# Patient Record
Sex: Male | Born: 1955 | Race: White | Hispanic: No | Marital: Married | Smoking: Current some day smoker
Health system: Southern US, Community
[De-identification: ages and names within clinical notes are randomized; demographics above are authoritative.]

## PROBLEM LIST (undated history)

## (undated) ENCOUNTER — Emergency Department (HOSPITAL_BASED_OUTPATIENT_CLINIC_OR_DEPARTMENT_OTHER): Admission: EM | Payer: Medicare (Managed Care) | Source: Home / Self Care

## (undated) DIAGNOSIS — F329 Major depressive disorder, single episode, unspecified: Secondary | ICD-10-CM

## (undated) DIAGNOSIS — M1990 Unspecified osteoarthritis, unspecified site: Secondary | ICD-10-CM

## (undated) DIAGNOSIS — M109 Gout, unspecified: Secondary | ICD-10-CM

## (undated) DIAGNOSIS — F32A Depression, unspecified: Secondary | ICD-10-CM

---

## 1999-04-30 ENCOUNTER — Ambulatory Visit (HOSPITAL_COMMUNITY): Admission: RE | Admit: 1999-04-30 | Discharge: 1999-04-30 | Payer: Self-pay | Admitting: Gastroenterology

## 2003-06-12 ENCOUNTER — Emergency Department (HOSPITAL_COMMUNITY): Admission: EM | Admit: 2003-06-12 | Discharge: 2003-06-12 | Payer: Self-pay | Admitting: Emergency Medicine

## 2003-06-12 ENCOUNTER — Encounter: Payer: Self-pay | Admitting: Emergency Medicine

## 2003-11-14 ENCOUNTER — Encounter: Admission: RE | Admit: 2003-11-14 | Discharge: 2003-11-14 | Payer: Self-pay | Admitting: Family Medicine

## 2004-10-01 ENCOUNTER — Ambulatory Visit (HOSPITAL_COMMUNITY): Admission: RE | Admit: 2004-10-01 | Discharge: 2004-10-01 | Payer: Self-pay | Admitting: Gastroenterology

## 2006-04-20 ENCOUNTER — Encounter (INDEPENDENT_AMBULATORY_CARE_PROVIDER_SITE_OTHER): Payer: Self-pay | Admitting: *Deleted

## 2006-04-20 ENCOUNTER — Ambulatory Visit (HOSPITAL_COMMUNITY): Admission: RE | Admit: 2006-04-20 | Discharge: 2006-04-21 | Payer: Self-pay | Admitting: Otolaryngology

## 2007-07-12 ENCOUNTER — Ambulatory Visit (HOSPITAL_BASED_OUTPATIENT_CLINIC_OR_DEPARTMENT_OTHER): Admission: RE | Admit: 2007-07-12 | Discharge: 2007-07-12 | Payer: Self-pay | Admitting: Orthopedic Surgery

## 2007-08-16 ENCOUNTER — Ambulatory Visit (HOSPITAL_BASED_OUTPATIENT_CLINIC_OR_DEPARTMENT_OTHER): Admission: RE | Admit: 2007-08-16 | Discharge: 2007-08-16 | Payer: Self-pay | Admitting: Orthopedic Surgery

## 2008-07-24 ENCOUNTER — Ambulatory Visit (HOSPITAL_BASED_OUTPATIENT_CLINIC_OR_DEPARTMENT_OTHER): Admission: RE | Admit: 2008-07-24 | Discharge: 2008-07-24 | Payer: Self-pay | Admitting: Orthopedic Surgery

## 2011-05-03 NOTE — Op Note (Signed)
Ralph Gutierrez, Ralph Gutierrez               ACCOUNT NO.:  [id number]   MEDICAL RECORD NO.:  [id number]          PATIENT TYPE:  AMB   LOCATION:  DSC                          FACILITY:  [facility]   PHYSICIAN:  Rodney A. Mortenson, M.D.DATE OF BIRTH:  August 02, 1956   DATE OF PROCEDURE:  07/12/2007  DATE OF DISCHARGE:                               OPERATIVE REPORT   JUSTIFICATION:  55 year old male with a two month history of pain about  his right shoulder.  No injury, just gradually increasing pain about the  shoulder.  He does a lot of extensive heavy lifting, pushing, and  pulling.  He has tried all types of anti-inflammatory drugs which have  failed.  Examination reveals acute tenderness over the [facility] joint and  cross arm test is quite positive.  Impingement testing is slightly  positive.  An MRI was done that shows a partial tear of the  supraspinatus, no full thickness tear, there is bony edema of the distal  clavicle with significant degenerative changes and osteoarthritis of the  [facility] joint and inferior projection of osteophytes.  He is now admitted for  surgical decompression.  Complications were discussed preoperatively.  Questions were answered and encouraged.   JUSTIFICATION FOR OUTPATIENT SURGERY:  Minimal morbidity.   PREOPERATIVE DIAGNOSIS:  Impingement syndrome, right shoulder; bone  spurs with inferior osteophytes acromioclavicular joint; osteoarthritis  acromioclavicular joint, right shoulder.   POSTOPERATIVE DIAGNOSIS:  Impingement syndrome, right shoulder; bone  spurs with inferior osteophytes acromioclavicular joint; osteoarthritis  acromioclavicular joint, right shoulder.   OPERATION:  Diagnostic arthroscopy, right shoulder; arthroscopic  acromioplasty with removal inferior bone spur; resection of right distal  clavicle arthroscopically.   SURGEON:  Lenard Galloway. Chaney Malling, M.D.   ASSISTANT:  Legrand Pitts. Duffy, P.A.-C.   ANESTHESIA:  General.   PROCEDURE:  The patient was placed on  the operating table in a supine  position.  After satisfactory general anesthesia, the patient was placed  in a semi-sitting position.  The right shoulder and upper extremity was  prepped with DuraPrep and draped out in the usual manner.  Standard  posterior portal was made and the arthroscope was introduced.  The  glenohumeral joint was visualized first.  The articular cartilage over  the humeral head and glenoid was actually normal as was the entire  circumference of the labrum.  The biceps was visualized all the way out  to its exit and this appeared normal.  There was some slight fraying of  the articular surface of the supraspinatus but no full thickness tear  was seen.  The arthroscope was then removed.   The arthroscope was placed in the subacromial space.  An anterolateral  portal was made.  The ArthroCare wand was inserted.  Soft tissues were  ablated.  The inferior surface of the acromion was seen.  Soft tissue  was stripped off.  The [facility] joint was identified and under surface of  distal clavicle had the soft tissue removed.  There were large bony  spurs on the anterior inferior aspect acromion as it abutted up against  the [facility] joint.  This clearly caused impingement  problems.  An anterior  portal was made at the level of the [facility] joint and an operating cannula  was placed in this position.  Through the anterolateral portal, the 6 mm  bur was inserted and a generous acromioplasty was done.  Inferior  osteophytes were then totally removed.  This exposed the [facility] joint very  nicely.  The blade was then used to remove all the soft tissue of the  inferior capsule of the [facility] joint and distal clavicle could clearly be  seen.  Through the anterior portal, the 6 mm bur was inserted.  The  under surface of the clavicle was debrided with bone spurs and then the  [facility] joint itself was entered with the bur.  About 6 mm resection distal  clavicle was achieved working from anterior to posterior.  Once  this was  accomplished to my satisfaction, the arthroscope was then placed in the  anterior portal and the arthroscope was passed down the trough was made  with a bur.  This completed decompression of distal clavicle from  posterior to anterior.  The clavicle was still quite stable.  I was  quite pleased with the decompression that was achieved.  Throughout the  procedure, bleeders were coagulated, there was good visualization  throughout.  The shoulder was then filled with Marcaine and sutures used  to close the wounds.  A large bulky dressing was applied and the patient  returned to the recovery room in excellent condition.  Technically, I  was extremely pleased with the surgical procedure.   DISPOSITION:  1. Percocet for pain.  2. To my office next week.  3. Usual postop instructions given.           ______________________________  Lenard Galloway. Chaney Malling, M.D.     RAM/MEDQ  D:  07/12/2007  T:  07/12/2007  Job:  [id number]

## 2011-05-03 NOTE — Op Note (Signed)
Ralph Gutierrez, Ralph Gutierrez               ACCOUNT NO.:  [id number]   MEDICAL RECORD NO.:  [id number]          PATIENT TYPE:  AMB   LOCATION:  DSC                          FACILITY:  [facility]   PHYSICIAN:  Rodney A. Mortenson, M.D.DATE OF BIRTH:  1956/11/18   DATE OF PROCEDURE:  08/16/2007  DATE OF DISCHARGE:                               OPERATIVE REPORT   JUSTIFICATION:  A 55 year old male who underwent arthroscopy of the  right shoulder in July 12, 2007 for osteoarthritis of right AC joint and  impingement syndrome.  He had acromioplasty and resection distal  clavicle through the arthroscope.  Postoperatively, he had a persistent  draining sinus through the anterior lateral portal.  Cultures taken  throughout.  He was never sick, never febrile.  Cultures were all  negative but the drainage has persisted and was not diminished.  He is  now admitted for evaluation of his shoulder to repair the draining sinus  or defect in the deltoid and see if there was a tear in the rotator  cuff.  At the time of initial surgery when the arthroscope was placed in  the shoulder itself, no operative portal was placed into the  glenohumeral joint.  All surgery was essentially done in the subacromial  space.  Questions answered, encouraged, complications discussed  extensively.   JUSTIFICATION FOR OUTPATIENT SETTING:  Minimal morbidity.   PREOPERATIVE DIAGNOSIS:  Draining sinus anterior lateral portal, right  shoulder.   POSTOPERATIVE DIAGNOSIS:  Draining sinus anterior lateral portal, right  shoulder defect in deltoid; subacromial bursitis.   OPERATION:  Diagnostic arthroscopy, glenohumeral joint; bursectomy  subacromial space; repair defect in the deltoid.   SURGEON:  Lenard Galloway. Chaney Malling, M.D.   Threasa HeadsChestine Spore.   ANESTHESIA:  General.   PROCEDURE:  The patient placed on the operating table in supine  position.  After satisfactory general anesthesia, the patient placed in  semi-sitting position,  right shoulder and upper extremity was prepped  with DuraPrep and draped out in the usual manner.  The scope placed in  the posterior portal in glenohumeral joint.  Very careful examination of  the joint was undertaken.  There was normal articular cartilage of the  humeral head in the glenoid.  The anterior labrum was normal as was the  subscapularis.  The biceps was visualized and was normal.  Very careful  exam of the anterior capsule above the subscapularis and above the  biceps was done.  No rents or tears were seen.  It should be noted at  the initial arthroscopic procedure there was no entrance into the  glenohumeral joint anteriorly.  No obvious defects could be seen.  The  arthroscope was removed from the glenohumeral humeral joint.   Arthroscope was then inserted into the subacromial space.  There is fair  amount of inflammation in the bursa.  Through the anterolateral portal  as a pressure was turned up on the arthroscope some drainage was seen.  An incision over lateral side through the anterolateral portal was  opened up somewhat and an ArthroCare wand was inserted.  A fair amount  of time  was spent ablating the remaining bursal tissue which was  inflamed.  No obvious infections were seen.  Once the ablation was  completed to my satisfaction, very careful exam of the subacromial  portion of the rotator cuff was done and no obvious tears were seen.  The large cannula was placed the anterior lateral portal and the  arthroscope removed from the subacromial space and placed in the  shoulder.  The pressure was turned up to 100 as this was injected into  the glenohumeral joint and no fluid came out of the large portal placed  in subacromial space.  There was absolutely no drainage through the cuff  that could be determined even at these very high pressures.  I think  this proved conclusively there were no tears in the cuff that were  causing the initial drainage and the arthroscope  was removed from  glenohumeral joint.  The cannula in the anterolateral portal was then  removed.  The anterolateral portal incision was extended proximal  distally.  The small pinhole could clearly be seen with draining sinus  extended into the subacromial space.  Deltoid fibers were teased from  the underlying skin.  The large 0 PDS sutures were used in mattress  fashion to close this very small sinus tract and repair the defect in  the deltoid.  Pressure was then turned back into the subacromial space  and there was absolutely no leakage through this site.  Skin was then  closed with stainless steel staples.  Sterile dressing applied and the  patient returned to recovery room in excellent condition.  Technically  this went extremely well.  I was very pleased with surgical outcome.   DISPOSITION:  1. Percocet, Keflex.  2. Sling right arm, do not use the arm.  3. To my office on Wednesday.           ______________________________  Lenard Galloway. Chaney Malling, M.D.     RAM/MEDQ  D:  08/16/2007  T:  08/17/2007  Job:  [id number]

## 2011-05-03 NOTE — Op Note (Signed)
NAMECHASETON, YEPIZ               ACCOUNT NO.:  [id number]   MEDICAL RECORD NO.:  [id number]          PATIENT TYPE:  AMB   LOCATION:  DSC                          FACILITY:  [facility]   PHYSICIAN:  Rodney A. Mortenson, M.D.DATE OF BIRTH:  Jan 25, 1956   DATE OF PROCEDURE:  07/24/2008  DATE OF DISCHARGE:                               OPERATIVE REPORT   JUSTIFICATION:  A 56 year old male with bilateral carpal tunnel, worse  on the left than on the right.  He has had significant symptoms and  bilateral positive Phalen test.  He is now admitted for decompression of  carpal tunnel on the left.  Complication were discussed preoperatively.  Questions answered and encouraged.   JUSTIFICATION FOR OUTPATIENT SURGERY:  Minimal morbidity.   PREOPERATIVE DIAGNOSIS:  Left carpal tunnel.   POSTOPERATIVE DIAGNOSIS:  Left carpal tunnel.   OPERATION:  Release of transverse carpal ligament, left wrist.   SURGEON:  Rodney A. Chaney Malling, MD   ANESTHESIA:  General.   PROCEDURE:  The patient was placed on the operating table in the supine  position with the pneumatic tourniquet above the left upper arm.  The  entire left upper extremity was prepped with DuraPrep and draped out in  the usual manner.  The left upper extremity was wrapped out with an  Esmarch and tourniquet was elevated.  Loupe magnification was used  throughout.  A lazy-S incision was made on the volar surface of the left  wrist starting proximal palmar crease and carried into the mid palmar  space.  Skin edges were retracted.  Fascia __________ was identified,  isolated, and opened and a curved Mayo scissors placed underneath the  transverse ligament and above the median nerve.  Under direct vision,  the transverse ligament was released off the ulnar border of the carpal  canal and this was decompressed out into the mid palmar space.  No space-  occupying lesions or other pathologies seen in the hand.  The skin edges  were then closed  with 4-0 nylon suture and a large bulky pressure  dressing applied and the patient returned to recovery room in excellent  condition.  Technically, this went extremely well.   DISPOSITION:  1. Percocet for pain.  2. To my office on Wednesday.  3. Usual postop instructions were given.      Rodney A. Chaney Malling, M.D.  Electronically Signed    RAM/MEDQ  D:  07/24/2008  T:  07/25/2008  Job:  [id number]

## 2011-05-06 NOTE — Op Note (Signed)
NAMEJIMMIE, Ralph Gutierrez               ACCOUNT NO.:  [id number]   MEDICAL RECORD NO.:  [id number]          PATIENT TYPE:  AMB   LOCATION:  ENDO                         FACILITY[facility]   PHYSICIAN:  John C. Madilyn Fireman, M.D.    DATE OF BIRTH:  19-May-1956   DATE OF PROCEDURE:  10/01/2004  DATE OF DISCHARGE:                                 OPERATIVE REPORT   INDICATIONS FOR PROCEDURE:  Average-risk colon cancer screening.   PROCEDURE:  The patient was placed in the left lateral decubitus position  and placed on the pulse monitor with continuous low flow oxygen delivered by  nasal cannula. The patient was sedated with 75 mcg of IV fentanyl and 6 mg  IV Versed. The Olympus video colonoscope was inserted into the rectum and  advanced to the cecum, confirmed by translumination of McBurney's point and  visualization of ileocecal valve and appendiceal orifice. The prep was  fairly good but suboptimal in some areas, and I could not rule out small  lesions less than 1 cm in diameter in all areas. Otherwise, the cecum,  ascending, transverse, descending, and sigmoid colon all appeared normal  with no masses polyps, diverticula, or other mucosal abnormalities. The  rectum likewise appeared normal, and retroflexed view of the anus revealed  no obvious internal hemorrhoids. The scope was then withdrawn, and the  patient returned to the recovery room in stable condition. The patient  tolerated the procedure well, and there were no immediate complications.   IMPRESSION:  Normal colonoscopy.   PLAN:  Next colon screening by sigmoidoscopy in 5 years.      JCH/MEDQ  D:  10/01/2004  T:  10/01/2004  Job:  [id number]   cc:   Sharlet Salina, M.D.  [address]  [place]  [place] [address]  Fax: [phone]

## 2011-05-06 NOTE — Op Note (Signed)
Ralph Gutierrez, Ralph Gutierrez               ACCOUNT NO.:  [id number]   MEDICAL RECORD NO.:  [id number]          PATIENT TYPE:  OIB   LOCATION:  2550                         FACILITY:  [facility]   PHYSICIAN:  Lucky Cowboy, MD         DATE OF BIRTH:  [birth year]   DATE OF PROCEDURE:  04/20/2006  DATE OF DISCHARGE:                                 OPERATIVE REPORT   PREOPERATIVE DIAGNOSIS:  Obstructive sleep apnea, right septal deviation,  bilateral inferior turbinate hypertrophy, tonsillectomy.   POSTOPERATIVE DIAGNOSIS:  Obstructive sleep apnea, right septal deviation,  bilateral inferior turbinate hypertrophy, tonsillectomy.   PROCEDURE:  Septoplasty, bilateral inferior turbinate reductions,  uvulopalatopharyngoplasty, tonsillectomy.   SURGEON:  Dr. Lucky Cowboy.   ANESTHESIA:  General endotracheal anesthesia.   ESTIMATED BLOOD LOSS:  20 mL.   SPECIMENS:  Tonsils and uvula.   COMPLICATIONS:  None.   INDICATIONS:  The patient is a 55 year old male with moderate obstructive  sleep apnea.  His RDI is 19.5.  Maximum oxygen desaturation is 90%.  He  cannot tolerate the CPAP machine due to claustrophobia.  For these reasons,  the above procedures are performed.   FINDINGS:  The patient was noted to have a markedly widened nasal septum  with a right-sided anterior septal deviation.  There was a profuse inferior  turbinate hypertrophy.  There was tonsillar hypertrophy and elongated soft  palate.   PROCEDURE:  The patient was taken to the operating room and placed on the  table in the supine position.  He was then placed under general endotracheal  anesthesia.  Each of the nasal cavities was decongested with Afrin on  cottonoid pledgets.  The septum was injected with 1% lidocaine with  1:100,000 of epinephrine, bilaterally.  The table was rotated  counterclockwise 90 degrees.  A left hemitransfixion incision was made using  a #15 blade.  Submucoperichondrial mucoperiosteal flaps were elevated  using  Therapist, nutritional.  Contralateral flaps were elevated after dividing the bony  cartilaginous septum.  Posterior portion of the septum was taken down using  an open Jansen-Middleton forceps and Takahashi forceps.  The anterior  portion was mobilized to the left by taking off approximately 1 - 2 mm of  the inferior septum all the way anteriorly.  It was were released from the  nasal spine.  The incision was used to elevate the mucoperichondrium on the  right side as well.  After securing the septum more to the left and more  towards the midline, a hemitransfixion incision was closed in a simple  interrupted fashion using 4-0 chromic.  A horizontal mattress stitch was  applied using 4-0 plain gut suture.  At this point, the inferior turbinates  were reduced.  The microdebrider was then used to take down the inferior one  half of both of the inferior turbinate mucosa.  The bone was taken down  using through cut forceps.  Suction cautery used for hemostasis.  The head  was then wrapped and the patient prepared for tonsillectomy.  Crowe-Davis  mouth gag with a #4 tongue blade was then placed intraorally,  opened and  suspended on a Mayo stand.  Palpation of the soft palate identified the  levator dimple.  The right palatine tonsil was grasped with Allis clamps  directed inferomedially.  The Harmonic scalpel was then used to excise the  tonsil staying within the peritonsillar space adjacent to the tonsillar  capsule.  The left palatine tonsil was removed in an identical fashion.  The  uvula and inferior palate were then taken down using the Harmonic scalpel.  The levator dimple was the inferior margin of the initiation of resection.  The mucosal edges were reapproximated in a simple interrupted fashion using  4-0 Vicryl.  An NG tube was placed down the esophagus for suctioning of the  gastric contents.  The mouth gag was removed noting no damage to the teeth  or soft tissues.  The table was  rotated clockwise 90 degrees to its original  position.  The patient was awakened from anesthesia and taken to the Post  Anesthesia Care Unit in stable condition.  There were no complications.      Lucky Cowboy, MD  Electronically Signed     SJ/MEDQ  D:  04/20/2006  T:  04/21/2006  Job:  [id number]   cc:   Sharlet Salina, M.D.  Fax: [phone]

## 2011-09-16 LAB — POCT HEMOGLOBIN-HEMACUE: Hemoglobin: 16.9

## 2011-10-03 LAB — POCT HEMOGLOBIN-HEMACUE
Hemoglobin: 16.7
Operator id: 112821

## 2015-04-06 ENCOUNTER — Other Ambulatory Visit: Payer: Self-pay | Admitting: Physician Assistant

## 2015-06-25 DIAGNOSIS — M659 Synovitis and tenosynovitis, unspecified: Secondary | ICD-10-CM | POA: Insufficient documentation

## 2015-09-17 DIAGNOSIS — M109 Gout, unspecified: Secondary | ICD-10-CM | POA: Insufficient documentation

## 2016-02-15 DIAGNOSIS — Z9109 Other allergy status, other than to drugs and biological substances: Secondary | ICD-10-CM | POA: Insufficient documentation

## 2016-02-15 DIAGNOSIS — E041 Nontoxic single thyroid nodule: Secondary | ICD-10-CM | POA: Insufficient documentation

## 2016-05-02 DIAGNOSIS — S82109A Unspecified fracture of upper end of unspecified tibia, initial encounter for closed fracture: Secondary | ICD-10-CM | POA: Insufficient documentation

## 2017-01-08 ENCOUNTER — Emergency Department (HOSPITAL_BASED_OUTPATIENT_CLINIC_OR_DEPARTMENT_OTHER): Payer: BLUE CROSS/BLUE SHIELD

## 2017-01-08 ENCOUNTER — Emergency Department (HOSPITAL_BASED_OUTPATIENT_CLINIC_OR_DEPARTMENT_OTHER)
Admission: EM | Admit: 2017-01-08 | Discharge: 2017-01-08 | Disposition: A | Discharge status: Home / Self Care | Payer: BLUE CROSS/BLUE SHIELD | Attending: Emergency Medicine | Admitting: Emergency Medicine

## 2017-01-08 ENCOUNTER — Encounter (HOSPITAL_BASED_OUTPATIENT_CLINIC_OR_DEPARTMENT_OTHER): Payer: Self-pay | Admitting: *Deleted

## 2017-01-08 DIAGNOSIS — S42022A Displaced fracture of shaft of left clavicle, initial encounter for closed fracture: Secondary | ICD-10-CM | POA: Diagnosis not present

## 2017-01-08 DIAGNOSIS — Y92009 Unspecified place in unspecified non-institutional (private) residence as the place of occurrence of the external cause: Secondary | ICD-10-CM | POA: Diagnosis not present

## 2017-01-08 DIAGNOSIS — Y9301 Activity, walking, marching and hiking: Secondary | ICD-10-CM | POA: Insufficient documentation

## 2017-01-08 DIAGNOSIS — F17290 Nicotine dependence, other tobacco product, uncomplicated: Secondary | ICD-10-CM | POA: Insufficient documentation

## 2017-01-08 DIAGNOSIS — Z79899 Other long term (current) drug therapy: Secondary | ICD-10-CM | POA: Diagnosis not present

## 2017-01-08 DIAGNOSIS — Y999 Unspecified external cause status: Secondary | ICD-10-CM | POA: Diagnosis not present

## 2017-01-08 DIAGNOSIS — W19XXXA Unspecified fall, initial encounter: Secondary | ICD-10-CM

## 2017-01-08 DIAGNOSIS — W010XXA Fall on same level from slipping, tripping and stumbling without subsequent striking against object, initial encounter: Secondary | ICD-10-CM | POA: Insufficient documentation

## 2017-01-08 DIAGNOSIS — S4992XA Unspecified injury of left shoulder and upper arm, initial encounter: Secondary | ICD-10-CM | POA: Diagnosis present

## 2017-01-08 HISTORY — DX: Depression, unspecified: F32.A

## 2017-01-08 HISTORY — DX: Major depressive disorder, single episode, unspecified: F32.9

## 2017-01-08 HISTORY — DX: Gout, unspecified: M10.9

## 2017-01-08 HISTORY — DX: Unspecified osteoarthritis, unspecified site: M19.90

## 2017-01-08 MED ORDER — OXYCODONE-ACETAMINOPHEN 5-325 MG PO TABS
1.0000 | ORAL_TABLET | Freq: Once | ORAL | Status: AC
  Administered 2017-01-08: 1 via ORAL
  Filled 2017-01-08: qty 1

## 2017-01-08 MED ORDER — OXYCODONE-ACETAMINOPHEN 5-325 MG PO TABS: 1.0000 | ORAL_TABLET | ORAL | 0 refills | Status: AC | PRN

## 2017-01-08 NOTE — ED Triage Notes (Signed)
Pt c/o fall x 1 day ago c/o left clavicle pain and rib pain

## 2017-01-08 NOTE — ED Provider Notes (Signed)
[facility] Provider Note   CSN: [id number] Arrival date & time: 01/08/17  1200     History   Chief Complaint Chief Complaint  Patient presents with  . Fall    HPI Ralph Gutierrez is a 61 y.o. male.  HPI Patient presents for evaluation of left clavicle and left rib pain after sustaining a fall last night. Patient states he was walking in the dark when he tripped over leg of furniture landing on his left clavicle and ribs. No medications prior to arrival. No numbness or weakness. Associated symptoms include bruising. No shortness of breath, chest pain, fever, chills, or cough. No head injury or loss of consciousness. No prodromal symptoms of chest pain, shortness breath, dizziness, or syncope.  Past Medical History:  Diagnosis Date  . Depression   . Gout   . Osteoarthritis     There are no active problems to display for this patient.   History reviewed. No pertinent surgical history.     Home Medications    Prior to Admission medications   Medication Sig Start Date End Date Taking? Authorizing Provider  allopurinol (ZYLOPRIM) 300 MG tablet Take 300 mg by mouth daily.   Yes Historical Provider, MD  colchicine 0.6 MG tablet Take 0.6 mg by mouth daily.   Yes Historical Provider, MD  eszopiclone (LUNESTA) 2 MG TABS tablet Take 3 mg by mouth at bedtime as needed for sleep. Take immediately before bedtime   Yes Historical Provider, MD  fluticasone (FLONASE) 50 MCG/ACT nasal spray Place into both nostrils daily.   Yes Historical Provider, MD  fluticasone (FLOVENT HFA) 110 MCG/ACT inhaler Inhale into the lungs 2 (two) times daily.   Yes Historical Provider, MD  gabapentin (NEURONTIN) 300 MG capsule Take 300 mg by mouth 3 (three) times daily.   Yes Historical Provider, MD  indomethacin (INDOCIN SR) 75 MG CR capsule Take 75 mg by mouth 2 (two) times daily with a meal.   Yes Historical Provider, MD  PARoxetine (PAXIL) 20 MG tablet Take 20 mg by mouth daily.   Yes  Historical Provider, MD  oxyCODONE-acetaminophen (PERCOCET/ROXICET) 5-325 MG tablet Take 1 tablet by mouth every 4 (four) hours as needed for severe pain. 01/08/17   Cheri Fowler, PA-C    Family History History reviewed. No pertinent family history.  Social History Social History  Substance Use Topics  . Smoking status: Current Every Day Smoker    Types: Cigars  . Smokeless tobacco: Not on file  . Alcohol use No     Allergies   Patient has no known allergies.   Review of Systems Review of Systems All other systems negative unless otherwise stated in HPI   Physical Exam Updated Vital Signs BP 177/85   Pulse 80   Temp 98.5 F (36.9 C)   Resp 18   Ht 6' (1.829 m)   Wt 95.3 kg   SpO2 100%   BMI 28.48 kg/m   Physical Exam  Constitutional: He is oriented to person, place, and time. He appears well-developed and well-nourished.  Non-toxic appearance. He does not have a sickly appearance. He does not appear ill.  HENT:  Head: Normocephalic and atraumatic.  Mouth/Throat: Oropharynx is clear and moist.  Eyes: Conjunctivae are normal.  Neck: Normal range of motion. Neck supple.  No cervical midline tenderness.  Cardiovascular: Normal rate and regular rhythm.   Pulses:      Radial pulses are 2+ on the right side, and 2+ on the left side.  Brisk capillary refill.  Pulmonary/Chest: Effort normal and breath sounds normal. No accessory muscle usage or stridor. No respiratory distress. He has no wheezes. He has no rhonchi. He has no rales. He exhibits tenderness.  Left distal clavicle with bruising, swelling, and tenderness. No skin tenting. Left mid anterior ribs tender to palpation without crepitus or instability. Flail chest.  Abdominal: Soft. Bowel sounds are normal. He exhibits no distension. There is no tenderness.  Musculoskeletal: Normal range of motion. He exhibits tenderness.  No thoracic or lumbar midline tenderness.  Lymphadenopathy:    He has no cervical adenopathy.   Neurological: He is alert and oriented to person, place, and time.  Strength and sensation intact.  Skin: Skin is warm and dry.  Psychiatric: He has a normal mood and affect. His behavior is normal.     ED Treatments / Results  Labs (all labs ordered are listed, but only abnormal results are displayed) Labs Reviewed - No data to display  EKG  EKG Interpretation None       Radiology Dg Ribs Unilateral W/chest Left  Result Date: 01/08/2017 CLINICAL DATA:  Fall. Left anterior lower rib pain. Initial encounter. EXAM: LEFT RIBS AND CHEST - 3+ VIEW COMPARISON:  Chest radiograph 01/31/2013 FINDINGS: The cardiomediastinal silhouette is within normal limits. The lungs are clear. No pleural effusion or pneumothorax is identified. A left clavicle fracture is more fully evaluated on separate radiographs. No definite acute or displaced rib fracture is identified. There is likely an old healed left ninth rib fracture. IMPRESSION: 1. No acute rib fracture identified. 2. Left clavicle fracture as detailed on separate study. Electronically Signed   By: Sebastian AcheAllen  Grady M.D.   On: 01/08/2017 13:09   Dg Clavicle Left  Result Date: 01/08/2017 CLINICAL DATA:  Fall.  Left clavicle pain.  Initial encounter. EXAM: LEFT CLAVICLE - 2+ VIEWS COMPARISON:  None. FINDINGS: There is a left clavicular shaft fracture at the junction of the middle and lateral thirds which demonstrates slightly greater than 1 shaft width displacement as well as overriding. The fracture is mildly comminuted. There is overlying soft tissue swelling. IMPRESSION: Displaced left clavicle fracture. Electronically Signed   By: Sebastian AcheAllen  Grady M.D.   On: 01/08/2017 13:05    Procedures Procedures (including critical care time)  Medications Ordered in ED Medications  oxyCODONE-acetaminophen (PERCOCET/ROXICET) 5-325 MG per tablet 1 tablet (1 tablet Oral Given 01/08/17 1344)     Initial Impression / Assessment and Plan / ED Course  I have  reviewed the triage vital signs and the nursing notes.  Pertinent labs & imaging results that were available during my care of the patient were reviewed by me and considered in my medical decision making (see chart for details).     Patient with mechanical fall now with displaced left clavicle fracture. No head injury or loss of consciousness. No other signs of trauma. Neurovascularly intact. Patient will be discharged with sling and pain control. He has an orthopedist. Instructed him to call him tomorrow to schedule follow-up.  Final Clinical Impressions(s) / ED Diagnoses   Final diagnoses:  Fall in home, initial encounter  Displaced fracture of shaft of left clavicle, initial encounter for closed fracture    New Prescriptions New Prescriptions   OXYCODONE-ACETAMINOPHEN (PERCOCET/ROXICET) 5-325 MG TABLET    Take 1 tablet by mouth every 4 (four) hours as needed for severe pain.     Cheri FowlerKayla Rikayla Demmon, PA-C 01/08/17 1347    Tilden FossaElizabeth Rees, MD 01/09/17 1220

## 2017-01-08 NOTE — ED Notes (Addendum)
Fell in BR last night, no LOC, pain to left clavicle and left ribs, has had prior clavicle injury to left. CMS intact to left hand

## 2017-01-08 NOTE — Discharge Instructions (Signed)
You sustained a displaced left clavicle fracture.  Please wear your sling at all times.  Take Percocet every 4-6 hours for pain.  Call your orthopedist tomorrow to schedule a follow up appointment.  Return to the ED for uncontrolled pain, fever, cough, numbness, weakness, or any new or concerning symptoms.

## 2017-01-09 DIAGNOSIS — S42022A Displaced fracture of shaft of left clavicle, initial encounter for closed fracture: Secondary | ICD-10-CM | POA: Insufficient documentation

## 2017-03-22 DIAGNOSIS — T84498A Other mechanical complication of other internal orthopedic devices, implants and grafts, initial encounter: Secondary | ICD-10-CM | POA: Insufficient documentation

## 2017-06-23 DIAGNOSIS — M1812 Unilateral primary osteoarthritis of first carpometacarpal joint, left hand: Secondary | ICD-10-CM | POA: Insufficient documentation

## 2017-07-18 DIAGNOSIS — M25512 Pain in left shoulder: Secondary | ICD-10-CM | POA: Insufficient documentation

## 2018-04-02 IMAGING — DX DG CLAVICLE*L*
2 series · 2 of 2 positions shown · non-contrast
Comparison: None.

CLINICAL DATA: Fall.  Left clavicle pain.  Initial encounter.

EXAM:
LEFT CLAVICLE - 2+ VIEWS

[clavicle ap]
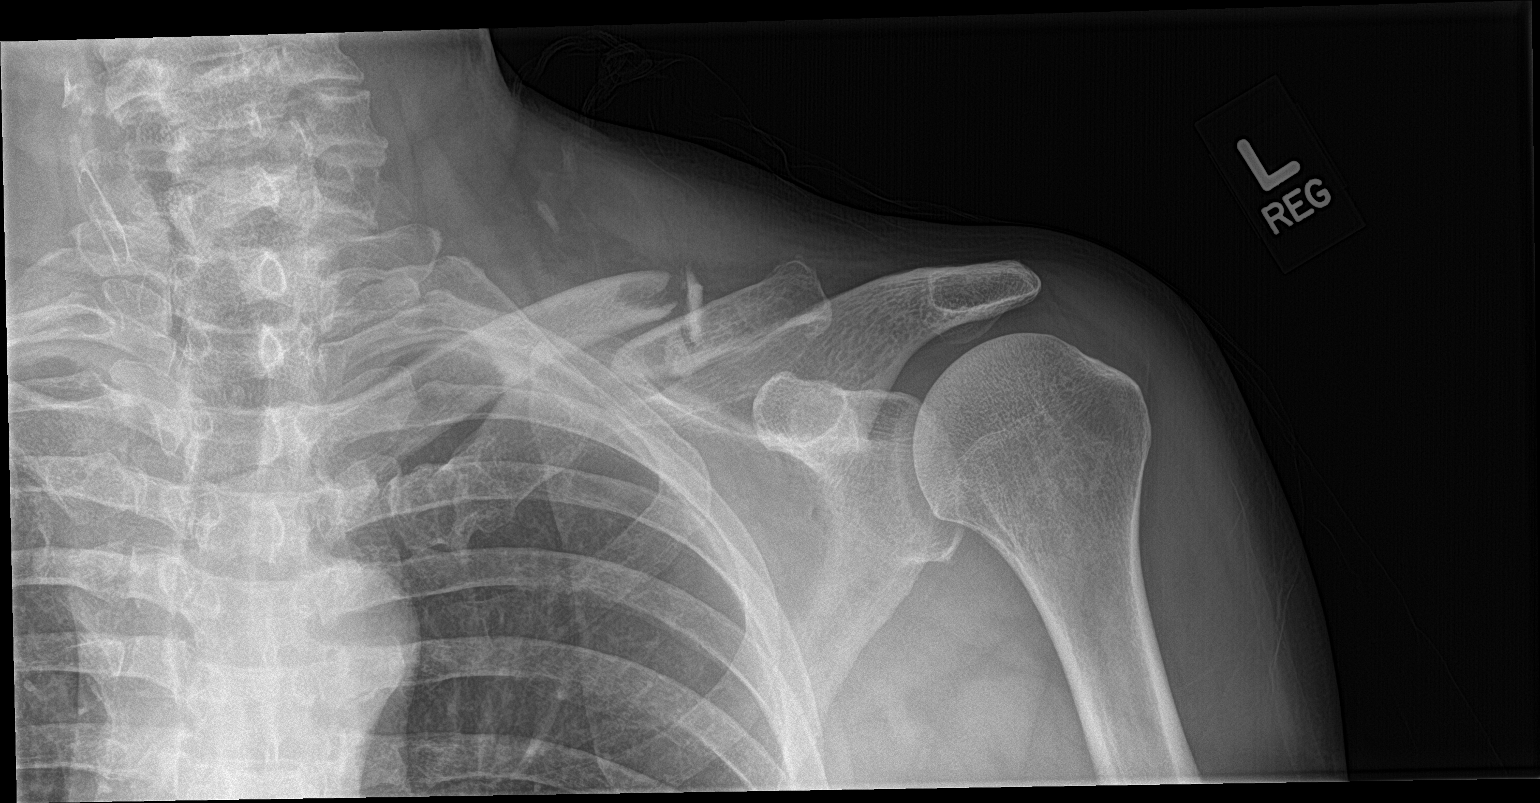

[clavicle axial]
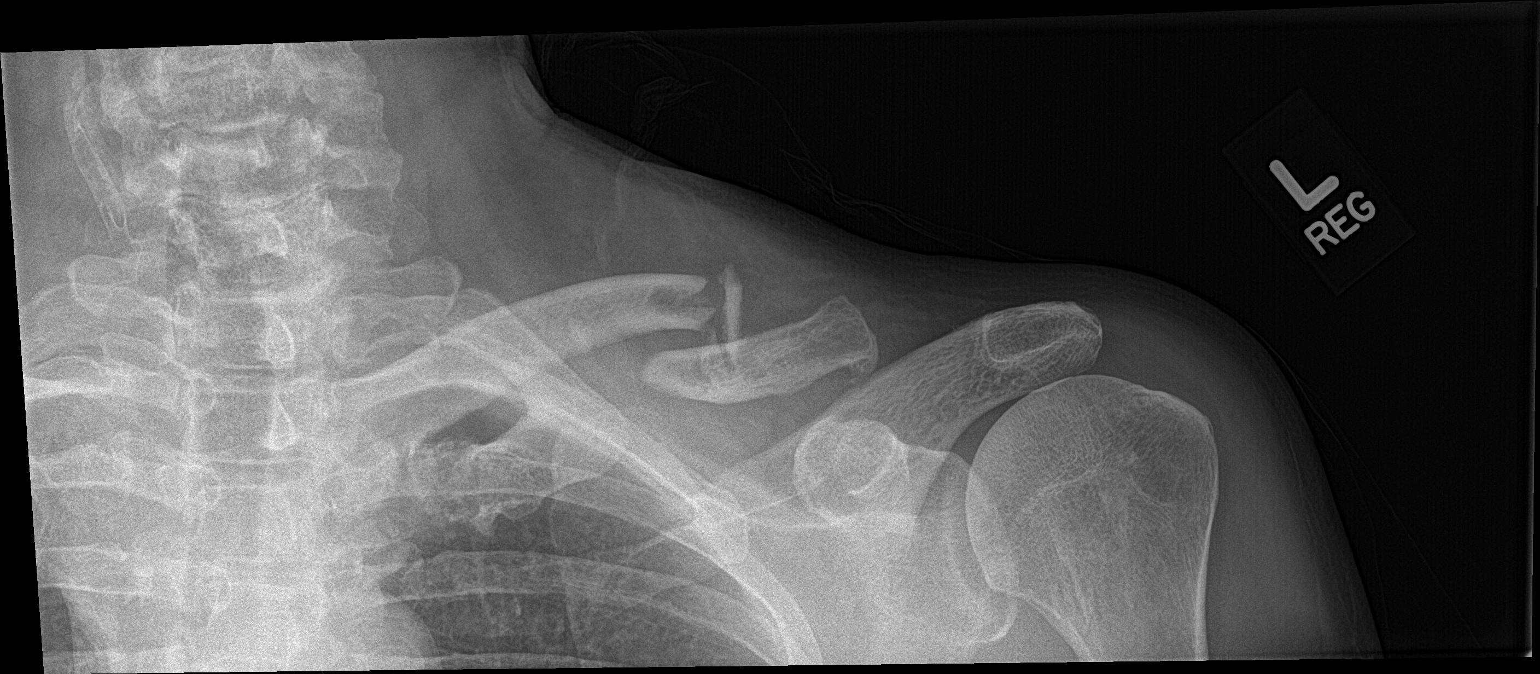

[2 of 2 positions shown; findings below may reference images not displayed]

FINDINGS: There is a left clavicular shaft fracture at the junction of the
middle and lateral thirds which demonstrates slightly greater than 1
shaft width displacement as well as overriding. The fracture is
mildly comminuted. There is overlying soft tissue swelling.
IMPRESSION: Displaced left clavicle fracture.

## 2018-04-19 DIAGNOSIS — M109 Gout, unspecified: Secondary | ICD-10-CM | POA: Insufficient documentation

## 2018-09-18 DIAGNOSIS — M4810 Ankylosing hyperostosis [Forestier], site unspecified: Secondary | ICD-10-CM | POA: Insufficient documentation

## 2018-12-28 DIAGNOSIS — M5030 Other cervical disc degeneration, unspecified cervical region: Secondary | ICD-10-CM | POA: Insufficient documentation

## 2018-12-28 DIAGNOSIS — M7551 Bursitis of right shoulder: Secondary | ICD-10-CM | POA: Insufficient documentation

## 2019-03-27 ENCOUNTER — Other Ambulatory Visit: Payer: Self-pay | Admitting: Physician Assistant

## 2019-03-27 NOTE — Telephone Encounter (Signed)
Last fill 06/2018 #90 No appts in epic

## 2019-04-02 ENCOUNTER — Telehealth: Payer: Self-pay

## 2019-04-02 NOTE — Telephone Encounter (Signed)
Prior authorization submitted for Eszopiclone 3mg  through [employer] approved effective CaseId:[id number];Status:Approved;03/02/2019;Coverage End Date:03/31/2020;

## 2019-06-22 ENCOUNTER — Other Ambulatory Visit: Payer: Self-pay | Admitting: Physician Assistant

## 2019-06-23 NOTE — Telephone Encounter (Signed)
Has appt 07/06 Monday not seen in epic yet

## 2019-06-24 ENCOUNTER — Encounter: Payer: Self-pay | Admitting: Physician Assistant

## 2019-06-24 ENCOUNTER — Ambulatory Visit (INDEPENDENT_AMBULATORY_CARE_PROVIDER_SITE_OTHER): Payer: BC Managed Care – PPO | Admitting: Physician Assistant

## 2019-06-24 ENCOUNTER — Other Ambulatory Visit: Payer: Self-pay

## 2019-06-24 DIAGNOSIS — F331 Major depressive disorder, recurrent, moderate: Secondary | ICD-10-CM | POA: Diagnosis not present

## 2019-06-24 DIAGNOSIS — G4700 Insomnia, unspecified: Secondary | ICD-10-CM

## 2019-06-24 MED ORDER — ESZOPICLONE 3 MG PO TABS: ORAL_TABLET | ORAL | 1 refills | Status: AC

## 2019-06-24 MED ORDER — GABAPENTIN 300 MG PO CAPS: 300.0000 mg | ORAL_CAPSULE | Freq: Every day | ORAL | 1 refills | Status: AC

## 2019-06-24 MED ORDER — PAROXETINE HCL 20 MG PO TABS: 20.0000 mg | ORAL_TABLET | Freq: Every day | ORAL | 1 refills | Status: AC

## 2019-06-24 NOTE — Progress Notes (Signed)
[facility] Med Check  Patient ID: Ralph Gutierrez,  MRN: [id number]  PCP: Patient, No Pcp Per  Date of Evaluation: 06/24/2019 Time spent:15 minutes  Chief Complaint:  Chief Complaint    Follow-up     Virtual Visit via Telephone Note  I connected with patient by a video enabled telemedicine application or telephone, with their informed consent, and verified patient privacy and that I am speaking with the correct person using two identifiers.  I am private, in my office and the patient is home.   I discussed the limitations, risks, security and privacy concerns of performing an evaluation and management service by telephone and the availability of in person appointments. I also discussed with the patient that there may be a patient responsible charge related to this service. The patient expressed understanding and agreed to proceed.   I discussed the assessment and treatment plan with the patient. The patient was provided an opportunity to ask questions and all were answered. The patient agreed with the plan and demonstrated an understanding of the instructions.   The patient was advised to call back or seek an in-person evaluation if the symptoms worsen or if the condition fails to improve as anticipated.  I provided 83minutes of non-face-to-face time during this encounter.  HISTORY/CURRENT STATUS: HPI For routine med check.   Doing really well.  Feels like meds are working well. Patient denies loss of interest in usual activities and is able to enjoy things.  Denies decreased energy or motivation.  Appetite has not changed.  No extreme sadness, tearfulness, or feelings of hopelessness.  Denies any changes in concentration, making decisions or remembering things.  Denies suicidal or homicidal thoughts.   He sleeps well with the Lunesta.  He usually has to take it every night or else he does not sleep, or at least not adequately.  He wakes up feeling rested.  His neurologist was  giving him the gabapentin to help with sleep.  It has been effective.  He is not needing to go to the neurologist anymore.  Work is going well.  He does have some anxiety concerning work and when he should retire.  But other than that things are fine.  Individual Medical History/ Review of Systems: Changes? :No    Past medications for mental health diagnoses include: uncertain  Allergies: Patient has no known allergies.  Current Medications:  Current Outpatient Medications:  .  allopurinol (ZYLOPRIM) 300 MG tablet, Take 300 mg by mouth daily., Disp: , Rfl:  .  colchicine 0.6 MG tablet, Take 0.6 mg by mouth daily., Disp: , Rfl:  .  Eszopiclone 3 MG TABS, TAKE 1 TABLET BY MOUTH EVERY DAY AT BEDTIME AS NEEDED SLEEP, Disp: 90 tablet, Rfl: 1 .  fluocinonide cream (LIDEX) 4.01 %, Apply 1 application topically 2 (two) times daily as needed., Disp: , Rfl:  .  fluticasone (FLONASE) 50 MCG/ACT nasal spray, Place into both nostrils daily., Disp: , Rfl:  .  gabapentin (NEURONTIN) 300 MG capsule, Take 1 capsule (300 mg total) by mouth at bedtime., Disp: 90 capsule, Rfl: 1 .  indomethacin (INDOCIN SR) 75 MG CR capsule, Take 75 mg by mouth 2 (two) times daily with a meal., Disp: , Rfl:  .  Multiple Vitamin (MULTIVITAMIN) tablet, Take 1 tablet by mouth daily., Disp: , Rfl:  .  PARoxetine (PAXIL) 20 MG tablet, Take 1 tablet (20 mg total) by mouth daily., Disp: 90 tablet, Rfl: 1 .  amLODipine (NORVASC) 2.5 MG tablet, , Disp: ,  Rfl:  .  fluticasone (FLOVENT HFA) 110 MCG/ACT inhaler, Inhale into the lungs 2 (two) times daily., Disp: , Rfl:  .  oxyCODONE-acetaminophen (PERCOCET/ROXICET) 5-325 MG tablet, Take 1 tablet by mouth every 4 (four) hours as needed for severe pain. (Patient not taking: Reported on 06/24/2019), Disp: 18 tablet, Rfl: 0 Medication Side Effects: none  Family Medical/ Social History: Changes? Working from home d/t Coronavirus. Also plans to retire in a few months.   MENTAL HEALTH  EXAM:  There were no vitals taken for this visit.There is no height or weight on file to calculate BMI.  General Appearance: unable to assess  Eye Contact:  unable to assess  Speech:  Clear and Coherent  Volume:  Normal  Mood:  Euthymic  Affect:  unable to assess  Thought Process:  Goal Directed  Orientation:  Full (Time, Place, and Person)  Thought Content: Logical   Suicidal Thoughts:  No  Homicidal Thoughts:  No  Memory:  WNL  Judgement:  Good  Insight:  Good  Psychomotor Activity:  unable to asses  Concentration:  Concentration: Good  Recall:  Good  Fund of Knowledge: Good  Language: Good  Assets:  Desire for Improvement  ADL's:  Intact  Cognition: WNL  Prognosis:  Good    DIAGNOSES:    ICD-10-CM   1. Insomnia, unspecified type  G47.00   2. Major depressive disorder, recurrent episode, moderate (HCC)  F33.1     Receiving Psychotherapy: No    RECOMMENDATIONS:  Continue Paxil 20 mg qd. Continue Gabapentin 300mg  1 qhs.  I have written that prescription for him since he is not seeing a neurologist anymore. Continue Lunesta 3 mg qhs prn. Return in 6 months.  Melony Overlyeresa Gregorio Worley, PA-C   This record has been created using [employer] software.  Chart creation errors have been sought, but may not always have been located and corrected. Such creation errors do not reflect on the standard of medical care.

## 2019-06-24 NOTE — Telephone Encounter (Signed)
Sent in already 

## 2020-03-24 ENCOUNTER — Other Ambulatory Visit: Payer: Self-pay | Admitting: Physician Assistant

## 2020-03-24 NOTE — Telephone Encounter (Signed)
Last visit 06/2019, due back 6 months

## 2022-12-21 DIAGNOSIS — L821 Other seborrheic keratosis: Secondary | ICD-10-CM | POA: Diagnosis not present

## 2022-12-21 DIAGNOSIS — L814 Other melanin hyperpigmentation: Secondary | ICD-10-CM | POA: Diagnosis not present

## 2022-12-21 DIAGNOSIS — L400 Psoriasis vulgaris: Secondary | ICD-10-CM | POA: Diagnosis not present

## 2022-12-21 DIAGNOSIS — D485 Neoplasm of uncertain behavior of skin: Secondary | ICD-10-CM | POA: Diagnosis not present

## 2022-12-21 DIAGNOSIS — D225 Melanocytic nevi of trunk: Secondary | ICD-10-CM | POA: Diagnosis not present

## 2022-12-21 DIAGNOSIS — L570 Actinic keratosis: Secondary | ICD-10-CM | POA: Diagnosis not present

## 2024-02-16 DIAGNOSIS — F5101 Primary insomnia: Secondary | ICD-10-CM | POA: Diagnosis not present

## 2024-02-16 DIAGNOSIS — F419 Anxiety disorder, unspecified: Secondary | ICD-10-CM | POA: Diagnosis not present

## 2024-02-16 DIAGNOSIS — Z125 Encounter for screening for malignant neoplasm of prostate: Secondary | ICD-10-CM | POA: Diagnosis not present

## 2024-02-16 DIAGNOSIS — R7303 Prediabetes: Secondary | ICD-10-CM | POA: Diagnosis not present

## 2024-02-16 DIAGNOSIS — E782 Mixed hyperlipidemia: Secondary | ICD-10-CM | POA: Diagnosis not present

## 2024-02-16 DIAGNOSIS — Z0000 Encounter for general adult medical examination without abnormal findings: Secondary | ICD-10-CM | POA: Diagnosis not present

## 2024-04-04 DIAGNOSIS — R21 Rash and other nonspecific skin eruption: Secondary | ICD-10-CM | POA: Diagnosis not present

## 2024-04-08 DIAGNOSIS — M545 Low back pain, unspecified: Secondary | ICD-10-CM | POA: Diagnosis not present

## 2024-04-25 DIAGNOSIS — M545 Low back pain, unspecified: Secondary | ICD-10-CM | POA: Diagnosis not present

## 2024-05-09 DIAGNOSIS — M25512 Pain in left shoulder: Secondary | ICD-10-CM | POA: Diagnosis not present

## 2024-05-20 DIAGNOSIS — H25011 Cortical age-related cataract, right eye: Secondary | ICD-10-CM | POA: Diagnosis not present

## 2024-05-20 DIAGNOSIS — H21233 Degeneration of iris (pigmentary), bilateral: Secondary | ICD-10-CM | POA: Diagnosis not present

## 2024-05-20 DIAGNOSIS — H52203 Unspecified astigmatism, bilateral: Secondary | ICD-10-CM | POA: Diagnosis not present

## 2024-05-20 DIAGNOSIS — R7303 Prediabetes: Secondary | ICD-10-CM | POA: Diagnosis not present

## 2024-05-20 DIAGNOSIS — H43811 Vitreous degeneration, right eye: Secondary | ICD-10-CM | POA: Diagnosis not present

## 2024-05-20 DIAGNOSIS — H5203 Hypermetropia, bilateral: Secondary | ICD-10-CM | POA: Diagnosis not present

## 2024-05-20 DIAGNOSIS — H2513 Age-related nuclear cataract, bilateral: Secondary | ICD-10-CM | POA: Diagnosis not present

## 2024-05-20 DIAGNOSIS — H524 Presbyopia: Secondary | ICD-10-CM | POA: Diagnosis not present

## 2024-06-07 DIAGNOSIS — E782 Mixed hyperlipidemia: Secondary | ICD-10-CM | POA: Diagnosis not present

## 2024-06-07 DIAGNOSIS — R7303 Prediabetes: Secondary | ICD-10-CM | POA: Diagnosis not present

## 2024-08-14 DIAGNOSIS — M25561 Pain in right knee: Secondary | ICD-10-CM | POA: Diagnosis not present

## 2024-08-14 DIAGNOSIS — M25562 Pain in left knee: Secondary | ICD-10-CM | POA: Diagnosis not present

## 2024-08-22 DIAGNOSIS — F5101 Primary insomnia: Secondary | ICD-10-CM | POA: Diagnosis not present

## 2024-08-22 DIAGNOSIS — E782 Mixed hyperlipidemia: Secondary | ICD-10-CM | POA: Diagnosis not present

## 2024-08-22 DIAGNOSIS — R7303 Prediabetes: Secondary | ICD-10-CM | POA: Diagnosis not present

## 2024-08-22 DIAGNOSIS — F419 Anxiety disorder, unspecified: Secondary | ICD-10-CM | POA: Diagnosis not present

## 2024-10-02 DIAGNOSIS — G309 Alzheimer's disease, unspecified: Secondary | ICD-10-CM | POA: Diagnosis not present

## 2024-10-02 DIAGNOSIS — Z820 Family history of epilepsy and other diseases of the nervous system: Secondary | ICD-10-CM | POA: Diagnosis not present

## 2024-10-02 DIAGNOSIS — F0284 Dementia in other diseases classified elsewhere, unspecified severity, with anxiety: Secondary | ICD-10-CM | POA: Diagnosis not present

## 2024-10-09 DIAGNOSIS — G43009 Migraine without aura, not intractable, without status migrainosus: Secondary | ICD-10-CM | POA: Diagnosis not present

## 2024-10-09 DIAGNOSIS — Z1371 Encounter for nonprocreative screening for genetic disease carrier status: Secondary | ICD-10-CM | POA: Diagnosis not present

## 2024-10-09 DIAGNOSIS — Z820 Family history of epilepsy and other diseases of the nervous system: Secondary | ICD-10-CM | POA: Diagnosis not present

## 2024-10-09 DIAGNOSIS — Z1379 Encounter for other screening for genetic and chromosomal anomalies: Secondary | ICD-10-CM | POA: Diagnosis not present

## 2024-10-09 DIAGNOSIS — G9089 Other disorders of autonomic nervous system: Secondary | ICD-10-CM | POA: Diagnosis not present

## 2024-11-13 DIAGNOSIS — M7041 Prepatellar bursitis, right knee: Secondary | ICD-10-CM | POA: Diagnosis not present

## 2024-12-09 DIAGNOSIS — H00014 Hordeolum externum left upper eyelid: Secondary | ICD-10-CM | POA: Diagnosis not present
